# Patient Record
Sex: Female | Born: 2000 | Hispanic: No | Marital: Single | Smoking: Never smoker
Health system: Southern US, Community
[De-identification: ages and names within clinical notes are randomized; demographics above are authoritative.]

---
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
## 2020-02-17 DIAGNOSIS — Z419 Encounter for procedure for purposes other than remedying health state, unspecified: Secondary | ICD-10-CM | POA: Diagnosis not present

## 2020-03-19 DIAGNOSIS — Z419 Encounter for procedure for purposes other than remedying health state, unspecified: Secondary | ICD-10-CM | POA: Diagnosis not present

## 2020-04-19 DIAGNOSIS — Z419 Encounter for procedure for purposes other than remedying health state, unspecified: Secondary | ICD-10-CM | POA: Diagnosis not present

## 2020-05-19 DIAGNOSIS — Z419 Encounter for procedure for purposes other than remedying health state, unspecified: Secondary | ICD-10-CM | POA: Diagnosis not present

## 2020-06-19 DIAGNOSIS — Z419 Encounter for procedure for purposes other than remedying health state, unspecified: Secondary | ICD-10-CM | POA: Diagnosis not present

## 2020-07-19 DIAGNOSIS — Z419 Encounter for procedure for purposes other than remedying health state, unspecified: Secondary | ICD-10-CM | POA: Diagnosis not present

## 2020-08-19 DIAGNOSIS — Z419 Encounter for procedure for purposes other than remedying health state, unspecified: Secondary | ICD-10-CM | POA: Diagnosis not present

## 2020-08-25 DIAGNOSIS — H5213 Myopia, bilateral: Secondary | ICD-10-CM | POA: Diagnosis not present

## 2020-09-19 DIAGNOSIS — Z419 Encounter for procedure for purposes other than remedying health state, unspecified: Secondary | ICD-10-CM | POA: Diagnosis not present

## 2020-10-17 DIAGNOSIS — Z419 Encounter for procedure for purposes other than remedying health state, unspecified: Secondary | ICD-10-CM | POA: Diagnosis not present

## 2020-11-17 DIAGNOSIS — Z419 Encounter for procedure for purposes other than remedying health state, unspecified: Secondary | ICD-10-CM | POA: Diagnosis not present

## 2020-12-17 DIAGNOSIS — Z419 Encounter for procedure for purposes other than remedying health state, unspecified: Secondary | ICD-10-CM | POA: Diagnosis not present

## 2021-01-17 DIAGNOSIS — Z419 Encounter for procedure for purposes other than remedying health state, unspecified: Secondary | ICD-10-CM | POA: Diagnosis not present

## 2021-02-16 DIAGNOSIS — Z419 Encounter for procedure for purposes other than remedying health state, unspecified: Secondary | ICD-10-CM | POA: Diagnosis not present

## 2021-03-19 DIAGNOSIS — Z419 Encounter for procedure for purposes other than remedying health state, unspecified: Secondary | ICD-10-CM | POA: Diagnosis not present

## 2021-04-19 DIAGNOSIS — Z419 Encounter for procedure for purposes other than remedying health state, unspecified: Secondary | ICD-10-CM | POA: Diagnosis not present

## 2021-05-19 DIAGNOSIS — Z419 Encounter for procedure for purposes other than remedying health state, unspecified: Secondary | ICD-10-CM | POA: Diagnosis not present

## 2021-06-13 DIAGNOSIS — Z3046 Encounter for surveillance of implantable subdermal contraceptive: Secondary | ICD-10-CM | POA: Diagnosis not present

## 2021-06-19 DIAGNOSIS — Z419 Encounter for procedure for purposes other than remedying health state, unspecified: Secondary | ICD-10-CM | POA: Diagnosis not present

## 2021-07-19 DIAGNOSIS — Z419 Encounter for procedure for purposes other than remedying health state, unspecified: Secondary | ICD-10-CM | POA: Diagnosis not present

## 2021-08-19 DIAGNOSIS — Z419 Encounter for procedure for purposes other than remedying health state, unspecified: Secondary | ICD-10-CM | POA: Diagnosis not present

## 2021-09-19 DIAGNOSIS — Z419 Encounter for procedure for purposes other than remedying health state, unspecified: Secondary | ICD-10-CM | POA: Diagnosis not present

## 2021-10-17 DIAGNOSIS — Z419 Encounter for procedure for purposes other than remedying health state, unspecified: Secondary | ICD-10-CM | POA: Diagnosis not present

## 2021-11-17 DIAGNOSIS — Z419 Encounter for procedure for purposes other than remedying health state, unspecified: Secondary | ICD-10-CM | POA: Diagnosis not present

## 2021-12-17 DIAGNOSIS — Z419 Encounter for procedure for purposes other than remedying health state, unspecified: Secondary | ICD-10-CM | POA: Diagnosis not present

## 2022-01-17 DIAGNOSIS — Z419 Encounter for procedure for purposes other than remedying health state, unspecified: Secondary | ICD-10-CM | POA: Diagnosis not present

## 2022-02-16 DIAGNOSIS — Z419 Encounter for procedure for purposes other than remedying health state, unspecified: Secondary | ICD-10-CM | POA: Diagnosis not present

## 2022-03-19 DIAGNOSIS — Z419 Encounter for procedure for purposes other than remedying health state, unspecified: Secondary | ICD-10-CM | POA: Diagnosis not present

## 2022-04-19 DIAGNOSIS — Z419 Encounter for procedure for purposes other than remedying health state, unspecified: Secondary | ICD-10-CM | POA: Diagnosis not present

## 2022-05-19 DIAGNOSIS — Z419 Encounter for procedure for purposes other than remedying health state, unspecified: Secondary | ICD-10-CM | POA: Diagnosis not present

## 2022-06-19 DIAGNOSIS — Z419 Encounter for procedure for purposes other than remedying health state, unspecified: Secondary | ICD-10-CM | POA: Diagnosis not present

## 2022-07-19 DIAGNOSIS — Z419 Encounter for procedure for purposes other than remedying health state, unspecified: Secondary | ICD-10-CM | POA: Diagnosis not present

## 2022-08-19 DIAGNOSIS — Z419 Encounter for procedure for purposes other than remedying health state, unspecified: Secondary | ICD-10-CM | POA: Diagnosis not present

## 2022-09-19 DIAGNOSIS — Z419 Encounter for procedure for purposes other than remedying health state, unspecified: Secondary | ICD-10-CM | POA: Diagnosis not present

## 2022-10-18 DIAGNOSIS — Z419 Encounter for procedure for purposes other than remedying health state, unspecified: Secondary | ICD-10-CM | POA: Diagnosis not present

## 2022-11-18 DIAGNOSIS — Z419 Encounter for procedure for purposes other than remedying health state, unspecified: Secondary | ICD-10-CM | POA: Diagnosis not present

## 2022-12-18 DIAGNOSIS — Z419 Encounter for procedure for purposes other than remedying health state, unspecified: Secondary | ICD-10-CM | POA: Diagnosis not present

## 2023-01-18 DIAGNOSIS — Z419 Encounter for procedure for purposes other than remedying health state, unspecified: Secondary | ICD-10-CM | POA: Diagnosis not present

## 2023-02-17 DIAGNOSIS — Z419 Encounter for procedure for purposes other than remedying health state, unspecified: Secondary | ICD-10-CM | POA: Diagnosis not present

## 2023-03-20 DIAGNOSIS — Z419 Encounter for procedure for purposes other than remedying health state, unspecified: Secondary | ICD-10-CM | POA: Diagnosis not present

## 2023-04-20 DIAGNOSIS — Z419 Encounter for procedure for purposes other than remedying health state, unspecified: Secondary | ICD-10-CM | POA: Diagnosis not present

## 2023-05-20 DIAGNOSIS — Z419 Encounter for procedure for purposes other than remedying health state, unspecified: Secondary | ICD-10-CM | POA: Diagnosis not present

## 2023-06-20 DIAGNOSIS — Z419 Encounter for procedure for purposes other than remedying health state, unspecified: Secondary | ICD-10-CM | POA: Diagnosis not present

## 2023-07-20 DIAGNOSIS — Z419 Encounter for procedure for purposes other than remedying health state, unspecified: Secondary | ICD-10-CM | POA: Diagnosis not present

## 2023-08-20 DIAGNOSIS — Z419 Encounter for procedure for purposes other than remedying health state, unspecified: Secondary | ICD-10-CM | POA: Diagnosis not present

## 2023-09-20 DIAGNOSIS — Z419 Encounter for procedure for purposes other than remedying health state, unspecified: Secondary | ICD-10-CM | POA: Diagnosis not present

## 2023-10-18 DIAGNOSIS — Z419 Encounter for procedure for purposes other than remedying health state, unspecified: Secondary | ICD-10-CM | POA: Diagnosis not present

## 2023-11-29 DIAGNOSIS — Z419 Encounter for procedure for purposes other than remedying health state, unspecified: Secondary | ICD-10-CM | POA: Diagnosis not present

## 2023-12-29 DIAGNOSIS — Z419 Encounter for procedure for purposes other than remedying health state, unspecified: Secondary | ICD-10-CM | POA: Diagnosis not present

## 2024-01-29 DIAGNOSIS — Z419 Encounter for procedure for purposes other than remedying health state, unspecified: Secondary | ICD-10-CM | POA: Diagnosis not present

## 2024-02-28 DIAGNOSIS — Z419 Encounter for procedure for purposes other than remedying health state, unspecified: Secondary | ICD-10-CM | POA: Diagnosis not present

## 2024-03-30 DIAGNOSIS — Z419 Encounter for procedure for purposes other than remedying health state, unspecified: Secondary | ICD-10-CM | POA: Diagnosis not present

## 2024-04-30 DIAGNOSIS — Z419 Encounter for procedure for purposes other than remedying health state, unspecified: Secondary | ICD-10-CM | POA: Diagnosis not present

## 2024-06-30 DIAGNOSIS — Z419 Encounter for procedure for purposes other than remedying health state, unspecified: Secondary | ICD-10-CM | POA: Diagnosis not present

## 2024-07-12 ENCOUNTER — Other Ambulatory Visit: Payer: Self-pay

## 2024-07-12 ENCOUNTER — Emergency Department (HOSPITAL_BASED_OUTPATIENT_CLINIC_OR_DEPARTMENT_OTHER)
Admission: EM | Admit: 2024-07-12 | Discharge: 2024-07-12 | Disposition: A | Discharge status: Home / Self Care | Payer: Worker's Compensation | Attending: Emergency Medicine | Admitting: Emergency Medicine

## 2024-07-12 ENCOUNTER — Other Ambulatory Visit (HOSPITAL_BASED_OUTPATIENT_CLINIC_OR_DEPARTMENT_OTHER): Payer: Self-pay

## 2024-07-12 ENCOUNTER — Encounter (HOSPITAL_BASED_OUTPATIENT_CLINIC_OR_DEPARTMENT_OTHER): Payer: Self-pay

## 2024-07-12 DIAGNOSIS — W19XXXA Unspecified fall, initial encounter: Secondary | ICD-10-CM | POA: Diagnosis not present

## 2024-07-12 DIAGNOSIS — Y990 Civilian activity done for income or pay: Secondary | ICD-10-CM | POA: Diagnosis not present

## 2024-07-12 DIAGNOSIS — M542 Cervicalgia: Secondary | ICD-10-CM | POA: Diagnosis not present

## 2024-07-12 DIAGNOSIS — S0990XA Unspecified injury of head, initial encounter: Secondary | ICD-10-CM | POA: Insufficient documentation

## 2024-07-12 DIAGNOSIS — Z79899 Other long term (current) drug therapy: Secondary | ICD-10-CM | POA: Diagnosis not present

## 2024-07-12 DIAGNOSIS — R319 Hematuria, unspecified: Secondary | ICD-10-CM | POA: Diagnosis not present

## 2024-07-12 MED ORDER — CYCLOBENZAPRINE HCL 10 MG PO TABS
10.0000 mg | ORAL_TABLET | Freq: Two times a day (BID) | ORAL | 0 refills | Status: AC | PRN
  Filled 2024-07-12: qty 20, 10d supply, fill #0

## 2024-07-12 NOTE — ED Triage Notes (Signed)
 The large club house window disconnected and fell onto her head Friday at work. Negative LOC. Soreness to top of head and increasing neck pain. Hematoma, no bleeding. No OTC medications.

## 2024-07-12 NOTE — ED Provider Notes (Signed)
 [place] EMERGENCY DEPARTMENT AT [facility] Provider Note   CSN: [id number] Arrival date & time: 07/12/24  9046     Patient presents with: Head Injury   Rachel Perry is a 23 y.o. female.   Patient is here for evaluation of injury that occurred while at work on Friday (3 days ago).  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  Denies loss of consciousness, nausea, vomiting, confusion, fainting spells, headaches, change in vision at the time of incident or since.  Patient is not taking a blood thinner.  She notes hematoma to the top of her head after the incident that is now resolved.  She is complaining of right sided base of neck pain.  No pain to spinal processes or paraspinal region in the cervical, thoracic, or lumbar spine.  The neck pain is worse with turning her head fully to the left or right.  She denies numbness or tingling, falls, or weakness.  She is here today for further evaluation at the request of her father who was concerned about the potential for intracranial hemorrhage.  The history is provided by the patient.  Head Injury Associated symptoms: neck pain        Prior to Admission medications   Medication Sig Start Date End Date Taking? Authorizing Provider  cyclobenzaprine  (FLEXERIL ) 10 MG tablet Take 1 tablet (10 mg total) by mouth 2 (two) times daily as needed for muscle spasms. 07/12/24  Yes Rosina Norris A, PA-C  ibuprofen  (ADVIL ,MOTRIN ) 600 MG tablet Take 1 tablet (600 mg total) by mouth every 6 (six) hours as needed. 10/11/16   Susy Pierce, MD  metroNIDAZOLE  (FLAGYL ) 500 MG tablet Take 1 tablet (500 mg total) by mouth 2 (two) times daily. 10/20/17   Jakie Mariel Boon, NP    Allergies: Penicillins    Review of Systems  Musculoskeletal:  Positive for neck pain.    Updated Vital Signs BP 113/71 (BP Location: Right Arm)   Pulse 82   Temp 98.5 F (36.9  C) (Oral)   Resp 16   Ht 5' (1.524 m)   Wt 72.6 kg   SpO2 98%   BMI 31.25 kg/m   Physical Exam Vitals and nursing note reviewed.  Constitutional:      General: She is not in acute distress.    Appearance: Normal appearance. She is not ill-appearing or diaphoretic.  HENT:     Head: Normocephalic.     Comments: There is no obvious injury to the top of the head.  Patient had difficulty localizing the pain to the top of the head and had to palpate deeply to find where soreness had been previously; again I was unable to appreciate an injury in this location.    Nose: Nose normal. No rhinorrhea.  Eyes:     General: No scleral icterus.    Extraocular Movements: Extraocular movements intact.     Conjunctiva/sclera: Conjunctivae normal.     Pupils: Pupils are equal, round, and reactive to light.  Neck:     Comments: Right base of neck/trapezius muscle tenderness and appreciated tightness.  Patient has full range of motion of the right upper extremity and full range of motion of the neck with slight tenderness at extremes of movement. Cardiovascular:     Rate and Rhythm: Normal rate and regular rhythm.     Pulses: Normal pulses.     Heart sounds: Normal heart sounds.  Pulmonary:     Effort: Pulmonary effort is normal.  Musculoskeletal:        General: Normal range of motion.     Cervical back: Normal range of motion and neck supple. Tenderness present. No rigidity.  Skin:    General: Skin is warm and dry.  Neurological:     Mental Status: She is alert and oriented to person, place, and time.     Sensory: No sensory deficit.     Motor: No weakness.     Coordination: Coordination normal.     Gait: Gait normal.     (all labs ordered are listed, but only abnormal results are displayed) Labs Reviewed - No data to display  EKG: None  Radiology: No results found.  Procedures   Medications Ordered in the ED - No data to display   Patient presents to the ED for concern of head  injury and right sided neck pain, this involves an extensive number of treatment options, and is a complaint that carries with it a high risk of complications and morbidity.  The differential diagnosis includes soft tissue injury, intracranial hemorrhage, spinal injury, muscle strain, skull fracture.   Additional history obtained:  Additional history obtained from friend  External records from outside source obtained and reviewed including additional history from friend   Test Considered:  CT head and cervical spine without contrast: Shared decision making with patient that based on symptoms and timing of injury, this imaging is not warranted at this time.   Problem List / ED Course:  Patient is here for evaluation of head injury and right-sided neck pain.  Based on physical exam findings and HPI, there is low likelihood of intracranial hemorrhage, skull fracture, or spinal injury.  Shared decision making with patient to defer imaging at this time.  Patient was educated on symptoms indicative of intracranial pressure/hemorrhage, she will return if any of these symptoms develop however considering we are 3 days out from injury this would be highly unlikely.   Reevaluation:  After the interventions noted above, I reevaluated the patient and found that they have :improved.  Patient reports improvement of anxiety with reassurance from provider.   Dispostion:  After consideration of the diagnostic results and the patients response to treatment, I feel that the patent would benefit from continued monitoring in the home setting.  Patient was educated on symptoms indicative of intracranial pressure/hemorrhage, she will return if any of these symptoms develop.                               Medical Decision Making Risk Prescription drug management.      Final diagnoses:  Injury of head, initial encounter  Neck pain on right side    ED Discharge Orders          Ordered     cyclobenzaprine  (FLEXERIL ) 10 MG tablet  2 times daily PRN        07/12/24 1231               Rosina Almarie LABOR, PA-C 07/12/24 1528    Bari Roxie HERO, DO 07/15/24 1308

## 2024-07-12 NOTE — Discharge Instructions (Addendum)
 As we discussed today based on your symptoms and timing since the injury there is a low likelihood for intracranial hemorrhage or spinal involvement.  Your neck pain is very likely musculoskeletal in nature.  I have sent a prescription for a muscle relaxer to your pharmacy.  You can also use a warm heating pad for pain relief.  Please return for further evaluation if you pain persists or worsens, or you develop persistent vomiting, confusion, loss of consciousness, change in vision, or any other concerning symptoms.

## 2024-07-12 NOTE — ED Notes (Signed)
 Pt d/c instructions, medications, and follow-up care reviewed with pt. Pt verbalized understanding and had no further questions at time of d/c. Pt CA&Ox4, ambulatory, and in NAD at time of d/c

## 2024-07-22 ENCOUNTER — Other Ambulatory Visit (HOSPITAL_BASED_OUTPATIENT_CLINIC_OR_DEPARTMENT_OTHER): Payer: Self-pay

## 2024-07-30 DIAGNOSIS — Z419 Encounter for procedure for purposes other than remedying health state, unspecified: Secondary | ICD-10-CM | POA: Diagnosis not present
